# Patient Record
Sex: Male | Born: 1990 | Marital: Single | Smoking: Never smoker
Health system: Southern US, Community
[De-identification: ages and names within clinical notes are randomized; demographics above are authoritative.]

---

## 2012-03-07 ENCOUNTER — Ambulatory Visit: Payer: Medicaid Other | Attending: Orthopedic Surgery | Admitting: Physical Therapy

## 2012-03-07 DIAGNOSIS — IMO0001 Reserved for inherently not codable concepts without codable children: Secondary | ICD-10-CM | POA: Insufficient documentation

## 2012-03-07 DIAGNOSIS — M6281 Muscle weakness (generalized): Secondary | ICD-10-CM | POA: Insufficient documentation

## 2012-03-07 DIAGNOSIS — R262 Difficulty in walking, not elsewhere classified: Secondary | ICD-10-CM | POA: Insufficient documentation

## 2012-03-07 DIAGNOSIS — M25559 Pain in unspecified hip: Secondary | ICD-10-CM | POA: Insufficient documentation

## 2012-03-14 ENCOUNTER — Encounter: Payer: Medicaid Other | Admitting: Rehabilitation

## 2012-03-19 ENCOUNTER — Ambulatory Visit: Payer: Medicaid Other | Admitting: Physical Therapy

## 2012-03-21 ENCOUNTER — Encounter: Payer: Medicaid Other | Admitting: Physical Therapy

## 2012-03-26 ENCOUNTER — Ambulatory Visit: Payer: Medicaid Other | Attending: Orthopedic Surgery | Admitting: Physical Therapy

## 2012-03-26 DIAGNOSIS — R262 Difficulty in walking, not elsewhere classified: Secondary | ICD-10-CM | POA: Insufficient documentation

## 2012-03-26 DIAGNOSIS — M25559 Pain in unspecified hip: Secondary | ICD-10-CM | POA: Insufficient documentation

## 2012-03-26 DIAGNOSIS — IMO0001 Reserved for inherently not codable concepts without codable children: Secondary | ICD-10-CM | POA: Insufficient documentation

## 2012-03-26 DIAGNOSIS — M6281 Muscle weakness (generalized): Secondary | ICD-10-CM | POA: Insufficient documentation

## 2012-03-28 ENCOUNTER — Ambulatory Visit: Payer: Medicaid Other | Admitting: Rehabilitation

## 2012-04-09 ENCOUNTER — Ambulatory Visit: Payer: Medicaid Other | Admitting: Physical Therapy

## 2012-04-11 ENCOUNTER — Ambulatory Visit: Payer: Medicaid Other | Admitting: Physical Therapy

## 2012-04-16 ENCOUNTER — Encounter: Payer: Medicaid Other | Admitting: Rehabilitation

## 2012-04-18 ENCOUNTER — Ambulatory Visit: Payer: Medicaid Other | Admitting: Rehabilitation

## 2012-04-23 ENCOUNTER — Ambulatory Visit: Payer: Medicaid Other | Admitting: Rehabilitation

## 2012-04-30 ENCOUNTER — Ambulatory Visit: Payer: Medicaid Other | Attending: Orthopedic Surgery | Admitting: Rehabilitation

## 2012-04-30 DIAGNOSIS — R262 Difficulty in walking, not elsewhere classified: Secondary | ICD-10-CM | POA: Insufficient documentation

## 2012-04-30 DIAGNOSIS — IMO0001 Reserved for inherently not codable concepts without codable children: Secondary | ICD-10-CM | POA: Insufficient documentation

## 2012-04-30 DIAGNOSIS — M6281 Muscle weakness (generalized): Secondary | ICD-10-CM | POA: Insufficient documentation

## 2012-04-30 DIAGNOSIS — M25559 Pain in unspecified hip: Secondary | ICD-10-CM | POA: Insufficient documentation

## 2012-05-09 ENCOUNTER — Ambulatory Visit: Payer: Medicaid Other | Admitting: Rehabilitation

## 2012-05-16 ENCOUNTER — Ambulatory Visit: Payer: Medicaid Other | Admitting: Physical Therapy

## 2012-07-16 ENCOUNTER — Ambulatory Visit: Payer: Medicaid Other | Admitting: Rehabilitative and Restorative Service Providers"

## 2012-07-23 ENCOUNTER — Ambulatory Visit: Payer: Medicaid Other | Attending: Orthopedic Surgery | Admitting: Physical Therapy

## 2012-07-23 DIAGNOSIS — M6281 Muscle weakness (generalized): Secondary | ICD-10-CM | POA: Insufficient documentation

## 2012-07-23 DIAGNOSIS — M25559 Pain in unspecified hip: Secondary | ICD-10-CM | POA: Insufficient documentation

## 2012-07-23 DIAGNOSIS — IMO0001 Reserved for inherently not codable concepts without codable children: Secondary | ICD-10-CM | POA: Insufficient documentation

## 2012-07-23 DIAGNOSIS — R262 Difficulty in walking, not elsewhere classified: Secondary | ICD-10-CM | POA: Insufficient documentation

## 2014-11-15 ENCOUNTER — Emergency Department (HOSPITAL_COMMUNITY): Payer: Medicaid Other

## 2014-11-15 ENCOUNTER — Emergency Department (HOSPITAL_COMMUNITY)
Admission: EM | Admit: 2014-11-15 | Discharge: 2014-11-15 | Disposition: A | Discharge status: Home / Self Care | Payer: Medicaid Other | Attending: Emergency Medicine | Admitting: Emergency Medicine

## 2014-11-15 ENCOUNTER — Encounter (HOSPITAL_COMMUNITY): Payer: Self-pay | Admitting: Physical Medicine and Rehabilitation

## 2014-11-15 DIAGNOSIS — N451 Epididymitis: Secondary | ICD-10-CM | POA: Insufficient documentation

## 2014-11-15 DIAGNOSIS — IMO0002 Reserved for concepts with insufficient information to code with codable children: Secondary | ICD-10-CM

## 2014-11-15 DIAGNOSIS — N508 Other specified disorders of male genital organs: Secondary | ICD-10-CM | POA: Diagnosis present

## 2014-11-15 LAB — URINALYSIS, ROUTINE W REFLEX MICROSCOPIC
Bilirubin Urine: NEGATIVE
GLUCOSE, UA: NEGATIVE mg/dL
Hgb urine dipstick: NEGATIVE
KETONES UR: NEGATIVE mg/dL
LEUKOCYTES UA: NEGATIVE
NITRITE: NEGATIVE
Protein, ur: NEGATIVE mg/dL
Specific Gravity, Urine: 1.027 (ref 1.005–1.030)
UROBILINOGEN UA: 1 mg/dL (ref 0.0–1.0)
pH: 7.5 (ref 5.0–8.0)

## 2014-11-15 MED ORDER — DOXYCYCLINE HYCLATE 100 MG PO CAPS: 100.0000 mg | ORAL_CAPSULE | Freq: Two times a day (BID) | ORAL | Status: AC

## 2014-11-15 MED ORDER — CEFTRIAXONE SODIUM 250 MG IJ SOLR
250.0000 mg | Freq: Once | INTRAMUSCULAR | Status: AC
Start: 2014-11-15 — End: 2014-11-15
  Administered 2014-11-15: 250 mg via INTRAMUSCULAR
  Filled 2014-11-15: qty 250

## 2014-11-15 MED ORDER — LIDOCAINE HCL (PF) 1 % IJ SOLN
INTRAMUSCULAR | Status: AC
  Administered 2014-11-15: 2 mL
  Filled 2014-11-15: qty 5

## 2014-11-15 NOTE — ED Provider Notes (Signed)
CSN: [id number]     Arrival date & time 11/15/14  1156 History   First MD Initiated Contact with Patient 11/15/14 1207     Chief Complaint  Patient presents with  . Testicle Pain     (Consider location/radiation/quality/duration/timing/severity/associated sxs/prior Treatment) HPI Comments: Patient presents with testicle pain. He states that last night he started having some pain in his left testicle that's been constant and slightly worsening since that time. It's worse when he sits down or walks. He denies any burning on urination or blood in his urine. He denies any penile discharge. He is sexually active with women. He denies any history of sexual transmitted diseases. He denies abdominal pain. There is no nausea or vomiting. He has not taken anything at home for the pain.  Patient is a 24 y.o. male presenting with testicular pain.  Testicle Pain Pertinent negatives include no chest pain, no abdominal pain, no headaches and no shortness of breath.    History reviewed. No pertinent past medical history. History reviewed. No pertinent past surgical history. No family history on file. History  Substance Use Topics  . Smoking status: Never Smoker   . Smokeless tobacco: Not on file  . Alcohol Use: Yes    Review of Systems  Constitutional: Negative for fever, chills, diaphoresis and fatigue.  HENT: Negative for congestion, rhinorrhea and sneezing.   Eyes: Negative.   Respiratory: Negative for cough, chest tightness and shortness of breath.   Cardiovascular: Negative for chest pain and leg swelling.  Gastrointestinal: Negative for nausea, vomiting, abdominal pain, diarrhea and blood in stool.  Genitourinary: Positive for testicular pain. Negative for frequency, hematuria, flank pain and difficulty urinating.  Musculoskeletal: Negative for back pain and arthralgias.  Skin: Negative for rash.  Neurological: Negative for dizziness, speech difficulty, weakness, numbness and headaches.       Allergies  Review of patient's allergies indicates no known allergies.  Home Medications   Prior to Admission medications   Medication Sig Start Date End Date Taking? Authorizing Provider  doxycycline (VIBRAMYCIN) 100 MG capsule Take 1 capsule (100 mg total) by mouth 2 (two) times daily. One po bid x 10 days 11/15/14   Rolan BuccoMelanie Lenni Reckner, MD   BP 108/59 mmHg  Pulse 59  Temp(Src) 98.8 F (37.1 C) (Oral)  Resp 18  SpO2 100% Physical Exam  Constitutional: He is oriented to person, place, and time. He appears well-developed and well-nourished.  HENT:  Head: Normocephalic and atraumatic.  Eyes: Pupils are equal, round, and reactive to light.  Neck: Normal range of motion. Neck supple.  Cardiovascular: Normal rate, regular rhythm and normal heart sounds.   Pulmonary/Chest: Effort normal and breath sounds normal. No respiratory distress. He has no wheezes. He has no rales. He exhibits no tenderness.  Abdominal: Soft. Bowel sounds are normal. There is no tenderness. There is no rebound and no guarding.  Genitourinary:  Normal circumcised male penis. He has tenderness to the left testicle. There is no abnormal lie. Normal cremasteric reflex. No pain over the epididymis. No hernias palpated. No rashes or sores noted.  Musculoskeletal: Normal range of motion. He exhibits no edema.  Lymphadenopathy:    He has no cervical adenopathy.  Neurological: He is alert and oriented to person, place, and time.  Skin: Skin is warm and dry. No rash noted.  Psychiatric: He has a normal mood and affect.    ED Course  Procedures (including critical care time) Labs Review Labs Reviewed  URINALYSIS, ROUTINE W REFLEX MICROSCOPIC  Imaging Review US Scrotum  11/15/2014   CLINICAL DATA:  Left testicular pain x1 day  EXAM: SCROTAL ULTRASOUND  DOPPLER ULTRASOUND OF THE TESTICLES  TECHNIQUE: Complete ultrasound examination of the testicles, epididymis, and other scrotal structures was performed. Color  and spectral Doppler ultrasound were also utilized to evaluate blood flow to the testicles.  COMPARISON:  None.  FINDINGS: Right testicle  Measurements: 3.6 x 1.5 x 3.0 cm. No mass or microlithiasis visualized.  Left testicle  Measurements: 3.6 x 1.6 x 2.3 cm. No mass or microlithiasis visualized.  Right epididymis:  Normal in size and appearance.  Left epididymis:  Normal in size and appearance.  Hydrocele:  None visualized.  Varicocele:  Present on the left.  Pulsed Doppler interrogation of both testes demonstrates normal low resistance arterial and venous waveforms bilaterally.  IMPRESSION: Normal sonographic appearance of the bilateral testes.  No evidence of testicular torsion.  Left scrotal varicocele.   Electronically Signed   By: Charline Bills M.D.   On: 11/15/2014 13:38   [place] Art/ven Flow Abd Pelv Doppler  11/15/2014   CLINICAL DATA:  Left testicular pain x1 day  EXAM: SCROTAL ULTRASOUND  DOPPLER ULTRASOUND OF THE TESTICLES  TECHNIQUE: Complete ultrasound examination of the testicles, epididymis, and other scrotal structures was performed. Color and spectral Doppler ultrasound were also utilized to evaluate blood flow to the testicles.  COMPARISON:  None.  FINDINGS: Right testicle  Measurements: 3.6 x 1.5 x 3.0 cm. No mass or microlithiasis visualized.  Left testicle  Measurements: 3.6 x 1.6 x 2.3 cm. No mass or microlithiasis visualized.  Right epididymis:  Normal in size and appearance.  Left epididymis:  Normal in size and appearance.  Hydrocele:  None visualized.  Varicocele:  Present on the left.  Pulsed Doppler interrogation of both testes demonstrates normal low resistance arterial and venous waveforms bilaterally.  IMPRESSION: Normal sonographic appearance of the bilateral testes.  No evidence of testicular torsion.  Left scrotal varicocele.   Electronically Signed   By: Charline Bills M.D.   On: 11/15/2014 13:38     EKG Interpretation None      MDM   Final diagnoses:   Epididymitis, left    Patient has no evidence of torsion. I feel with his exam he likely should be treated for probable epididymitis. He was treated with Rocephin and doxycycline. He was given outpatient referral to follow-up with [facility] if the symptoms are not improving.    Rolan Bucco, MD 11/15/14 [phone]

## 2014-11-15 NOTE — Discharge Instructions (Signed)
Epididymitis °Epididymitis is a swelling (inflammation) of the epididymis. The epididymis is a cord-like structure along the back part of the testicle. Epididymitis is usually, but not always, caused by infection. This is usually a sudden problem beginning with chills, fever and pain behind the scrotum and in the testicle. There may be swelling and redness of the testicle. °DIAGNOSIS  °Physical examination will reveal a tender, swollen epididymis. Sometimes, cultures are obtained from the urine or from prostate secretions to help find out if there is an infection or if the cause is a different problem. Sometimes, blood work is performed to see if your white blood cell count is elevated and if a germ (bacterial) or viral infection is present. Using this knowledge, an appropriate medicine which kills germs (antibiotic) can be chosen by your caregiver. A viral infection causing epididymitis will most often go away (resolve) without treatment. °HOME CARE INSTRUCTIONS  °· Hot sitz baths for 20 minutes, 4 times per day, may help relieve pain. °· Only take over-the-counter or prescription medicines for pain, discomfort or fever as directed by your caregiver. °· Take all medicines, including antibiotics, as directed. Take the antibiotics for the full prescribed length of time even if you are feeling better. °· It is very important to keep all follow-up appointments. °SEEK IMMEDIATE MEDICAL CARE IF:  °· You have a fever. °· You have pain not relieved with medicines. °· You have any worsening of your problems. °· Your pain seems to come and go. °· You develop pain, redness, and swelling in the scrotum and surrounding areas. °MAKE SURE YOU:  °· Understand these instructions. °· Will watch your condition. °· Will get help right away if you are not doing well or get worse. °Document Released: 06/09/2000 Document Revised: 09/04/2011 Document Reviewed: 04/29/2009 °[employer]® Patient Information ©2015 [employer]. This information  is not intended to replace advice given to you by your health care provider. Make sure you discuss any questions you have with your health care provider. ° °

## 2014-11-15 NOTE — ED Notes (Signed)
Pt is tender to palpation on left testicle.

## 2014-11-15 NOTE — ED Notes (Signed)
Pt reports L testicle pain. Onset Saturday evening. Denies recent injury. Denies urinary symptoms. 5/10 pain upon arrival to ED.

## 2014-11-15 NOTE — ED Notes (Signed)
Pt is in stable condition upon d/c and ambulates from ED. 

## 2015-02-08 ENCOUNTER — Encounter (HOSPITAL_COMMUNITY): Payer: Self-pay | Admitting: *Deleted

## 2015-02-08 ENCOUNTER — Emergency Department (HOSPITAL_COMMUNITY)
Admission: EM | Admit: 2015-02-08 | Discharge: 2015-02-08 | Disposition: A | Discharge status: Home / Self Care | Payer: Medicaid Other | Attending: Emergency Medicine | Admitting: Emergency Medicine

## 2015-02-08 DIAGNOSIS — Z79899 Other long term (current) drug therapy: Secondary | ICD-10-CM | POA: Insufficient documentation

## 2015-02-08 DIAGNOSIS — R109 Unspecified abdominal pain: Secondary | ICD-10-CM

## 2015-02-08 LAB — COMPREHENSIVE METABOLIC PANEL
ALK PHOS: 59 U/L (ref 38–126)
ALT: 17 U/L (ref 17–63)
AST: 23 U/L (ref 15–41)
Albumin: 3.7 g/dL (ref 3.5–5.0)
Anion gap: 8 (ref 5–15)
BUN: 10 mg/dL (ref 6–20)
CALCIUM: 8.8 mg/dL — AB (ref 8.9–10.3)
CHLORIDE: 106 mmol/L (ref 101–111)
CO2: 25 mmol/L (ref 22–32)
CREATININE: 0.86 mg/dL (ref 0.61–1.24)
GFR calc Af Amer: >60 mL/min (ref >60)
GFR calc non Af Amer: >60 mL/min (ref >60)
Glucose, Bld: 99 mg/dL (ref 65–99)
Potassium: 3.9 mmol/L (ref 3.5–5.1)
SODIUM: 139 mmol/L (ref 135–145)
Total Bilirubin: 0.9 mg/dL (ref 0.3–1.2)
Total Protein: 6.3 g/dL — ABNORMAL LOW (ref 6.5–8.1)

## 2015-02-08 LAB — URINALYSIS, ROUTINE W REFLEX MICROSCOPIC
BILIRUBIN URINE: NEGATIVE
GLUCOSE, UA: NEGATIVE mg/dL
Hgb urine dipstick: NEGATIVE
KETONES UR: NEGATIVE mg/dL
Leukocytes, UA: NEGATIVE
Nitrite: NEGATIVE
PROTEIN: NEGATIVE mg/dL
Specific Gravity, Urine: 1.018 (ref 1.005–1.030)
Urobilinogen, UA: 0.2 mg/dL (ref 0.0–1.0)
pH: 7.5 (ref 5.0–8.0)

## 2015-02-08 LAB — CBC
HCT: 39.4 % (ref 39.0–52.0)
Hemoglobin: 13.4 g/dL (ref 13.0–17.0)
MCH: 30.5 pg (ref 26.0–34.0)
MCHC: 34 g/dL (ref 30.0–36.0)
MCV: 89.5 fL (ref 78.0–100.0)
PLATELETS: 215 10*3/uL (ref 150–400)
RBC: 4.4 MIL/uL (ref 4.22–5.81)
RDW: 12.1 % (ref 11.5–15.5)
WBC: 3.7 10*3/uL — ABNORMAL LOW (ref 4.0–10.5)

## 2015-02-08 LAB — LIPASE, BLOOD: LIPASE: 31 U/L (ref 22–51)

## 2015-02-08 NOTE — ED Notes (Signed)
Pt reports being woken up by lower abd pain this am. Denies urinary symptoms or n/v/d.

## 2015-02-08 NOTE — ED Provider Notes (Signed)
CSN: [id number]     Arrival date & time 02/08/15  1059 History   First MD Initiated Contact with Patient 02/08/15 1221     Chief Complaint  Patient presents with  . Abdominal Pain     (Consider location/radiation/quality/duration/timing/severity/associated sxs/prior Treatment) HPI Patient presents to the emergency department with abdominal pain that woke him up this morning.  The patient states that he had significant left-sided lower abdominal pain that awoke him from sleep.  Patient states this time.  He has no pain.  He denies nausea, vomiting, diarrhea, chest pain, shortness of breath, back pain, fever, incontinence, hematemesis, bloody stool, dysuria, hematuria, or syncope.  The patient states that he currently has no pain that has been pain-free for several hours.  Patient did not take any medications prior to arrival for his symptoms History reviewed. No pertinent past medical history. History reviewed. No pertinent past surgical history. History reviewed. No pertinent family history. Social History  Substance Use Topics  . Smoking status: Never Smoker   . Smokeless tobacco: None  . Alcohol Use: Yes    Review of Systems All other systems negative except as documented in the HPI. All pertinent positives and negatives as reviewed in the HPI.  Allergies  Review of patient's allergies indicates no known allergies.  Home Medications   Prior to Admission medications   Medication Sig Start Date End Date Taking? Authorizing Provider  Multiple Vitamins-Minerals (MULTIVITAMIN WITH MINERALS) tablet Take 1 tablet by mouth daily.   Yes Historical Provider, MD  doxycycline (VIBRAMYCIN) 100 MG capsule Take 1 capsule (100 mg total) by mouth 2 (two) times daily. One po bid x 10 days Patient not taking: Reported on 02/08/2015 11/15/14   Rolan Bucco, MD   BP 110/71 mmHg  Pulse 54  Temp(Src) 98 F (36.7 C) (Oral)  Resp 18  Ht  (1.702 m)  Wt 120 lb (54.432 kg)  BMI 18.79 kg/m2   SpO2 100% Physical Exam  Constitutional: He is oriented to person, place, and time. He appears well-developed and well-nourished. No distress.  HENT:  Head: Normocephalic and atraumatic.  Mouth/Throat: Oropharynx is clear and moist.  Eyes: Pupils are equal, round, and reactive to light.  Neck: Normal range of motion. Neck supple.  Cardiovascular: Normal rate, regular rhythm and normal heart sounds.  Exam reveals no gallop and no friction rub.   No murmur heard. Pulmonary/Chest: Effort normal and breath sounds normal. No respiratory distress.  Abdominal: Soft. Bowel sounds are normal. He exhibits no distension. There is no tenderness. There is no rebound and no guarding.  Neurological: He is alert and oriented to person, place, and time. He exhibits normal muscle tone. Coordination normal.  Skin: Skin is warm and dry. No rash noted. No erythema.  Psychiatric: He has a normal mood and affect. His behavior is normal.  Nursing note and vitals reviewed.   ED Course  Procedures (including critical care time) Labs Review Labs Reviewed  COMPREHENSIVE METABOLIC PANEL - Abnormal; Notable for the following:    Calcium 8.8 (*)    Total Protein 6.3 (*)    All other components within normal limits  CBC - Abnormal; Notable for the following:    WBC 3.7 (*)    All other components within normal limits  URINALYSIS, ROUTINE W REFLEX MICROSCOPIC (NOT AT [facility]) - Abnormal; Notable for the following:    APPearance HAZY (*)    All other components within normal limits  LIPASE, BLOOD    Imaging Review No results  found. I, Theran Vandergrift W, personally reviewed and evaluated these images and lab results as part of my medical decision-making.   Patient currently has no pain.  He will be discharged home and advised him to return here for any worsening in his condition.  The patient agrees to the plan and all questions were answered  Charlestine Night, PA-C 02/08/15 1600  Laurence Spates,  MD 02/08/15 [phone]

## 2015-02-08 NOTE — Discharge Instructions (Signed)
Return here as needed.  Follow up with a primary care doctor °

## 2015-11-25 IMAGING — US US ART/VEN ABD/PELV/SCROTUM DOPPLER LTD
1 series · 14 of 25 positions shown · non-contrast
Comparison: None.

CLINICAL DATA: Left testicular pain x1 day

EXAM:
SCROTAL ULTRASOUND
DOPPLER ULTRASOUND OF THE TESTICLES
TECHNIQUE: Complete ultrasound examination of the testicles, epididymis, and
other scrotal structures was performed. Color and spectral Doppler
ultrasound were also utilized to evaluate blood flow to the
testicles.

[Series 1: us art/ven abd/pelv/scrotum doppler ltd · 0.05mm/px · 64 acquisitions, 14 frames shown]
[im 1/64]
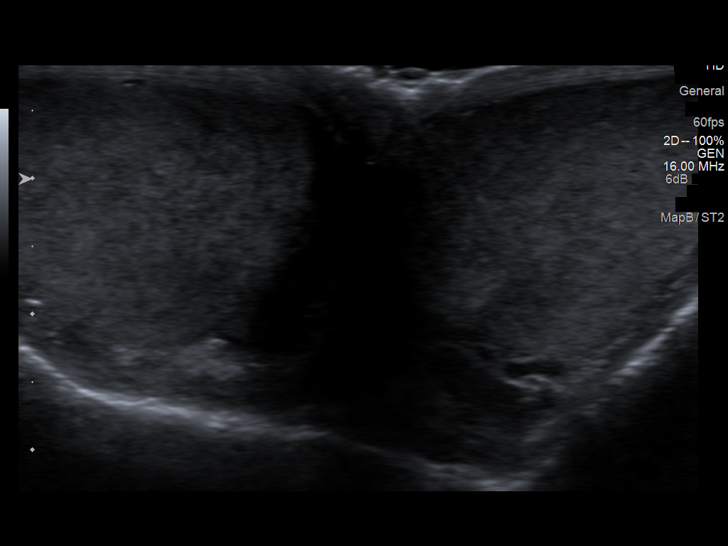
[im 6/64]
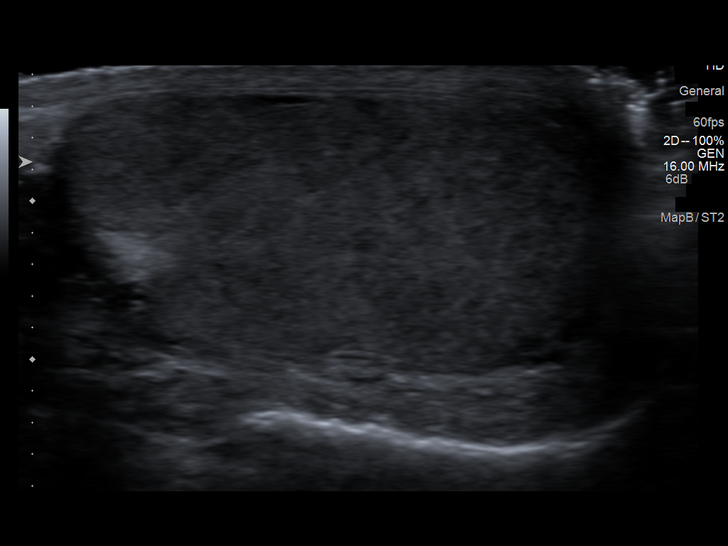
[im 11/64]
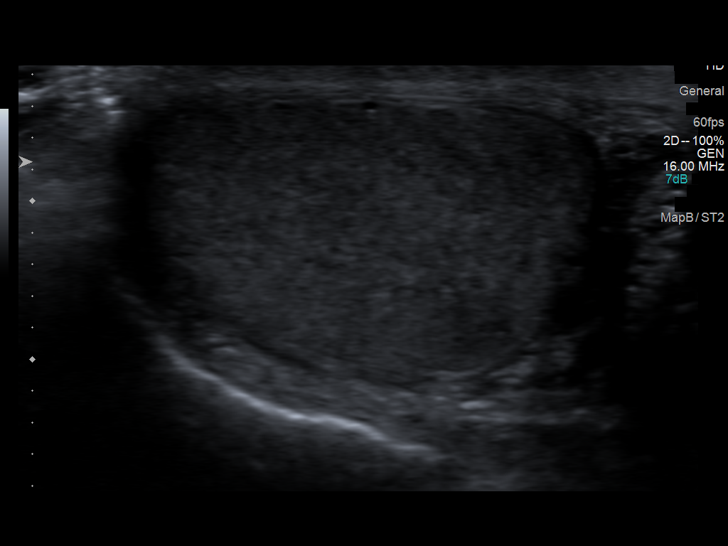
[im 16/64]
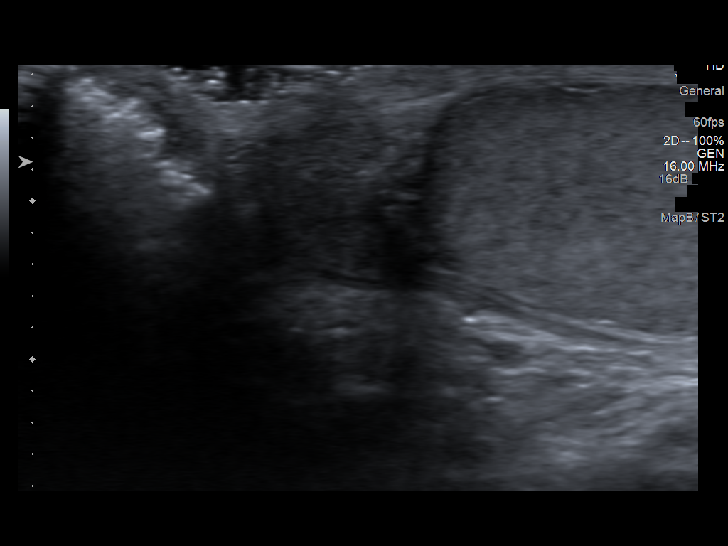
[im 22/64]
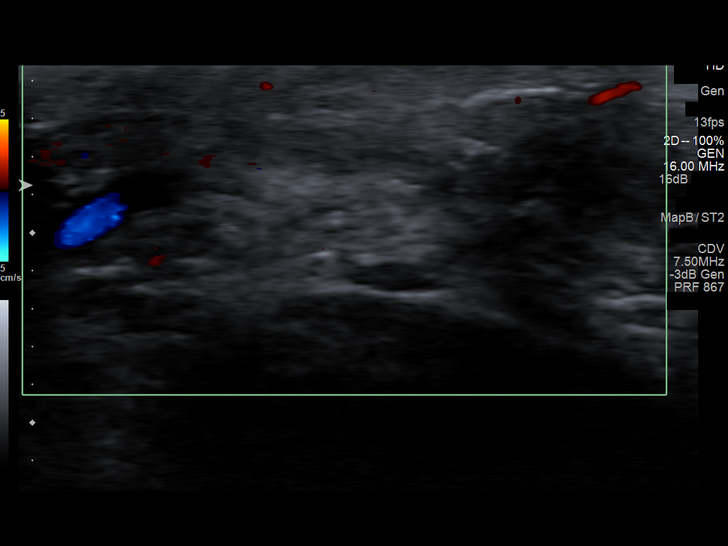
[im 24/64]
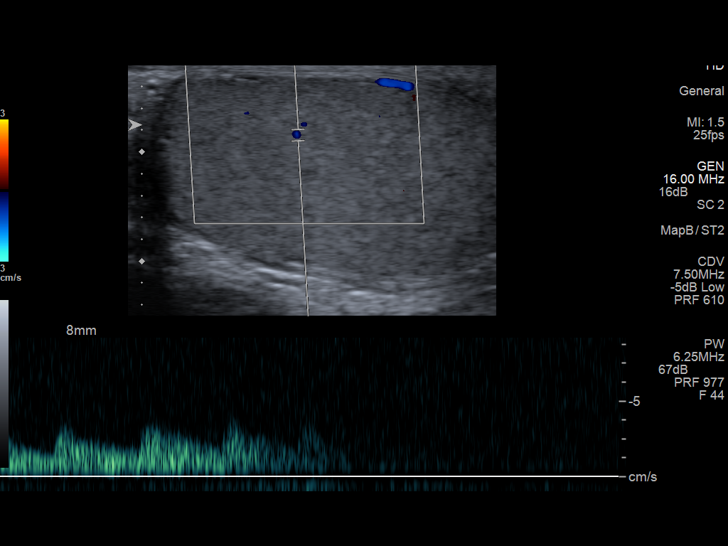
[im 29/64]
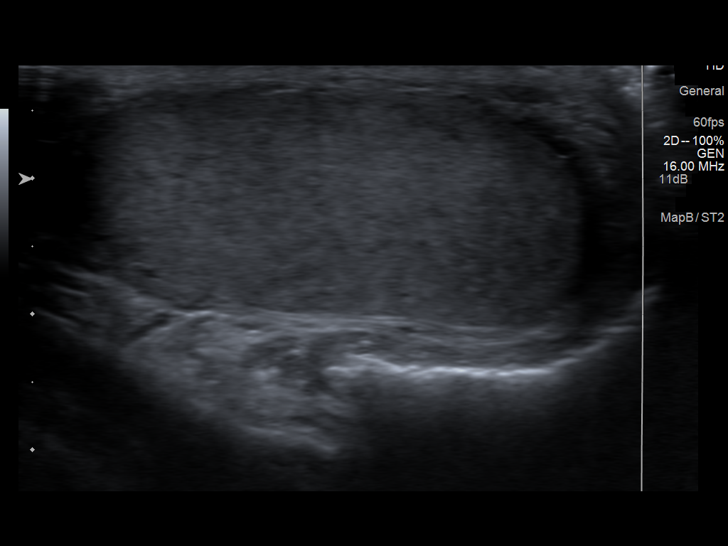
[im 35/64]
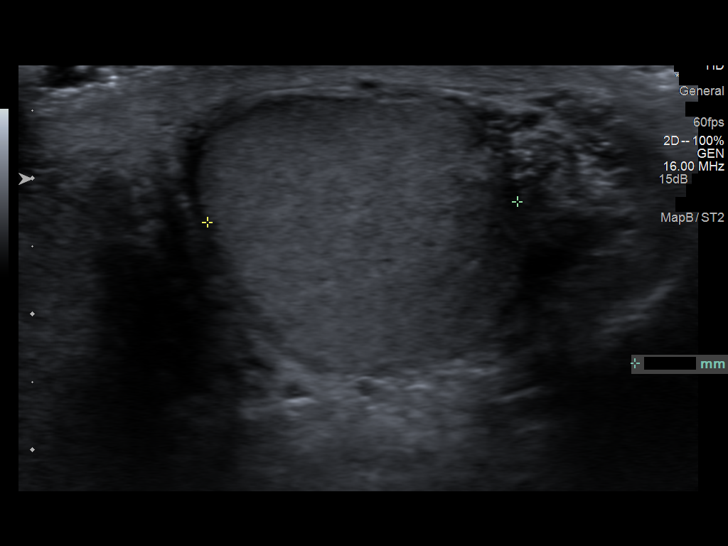
[im 40/64]
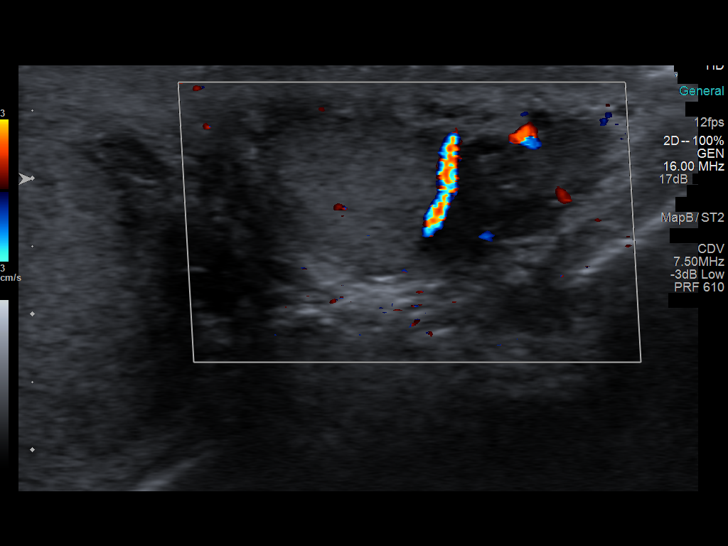
[im 43/64]
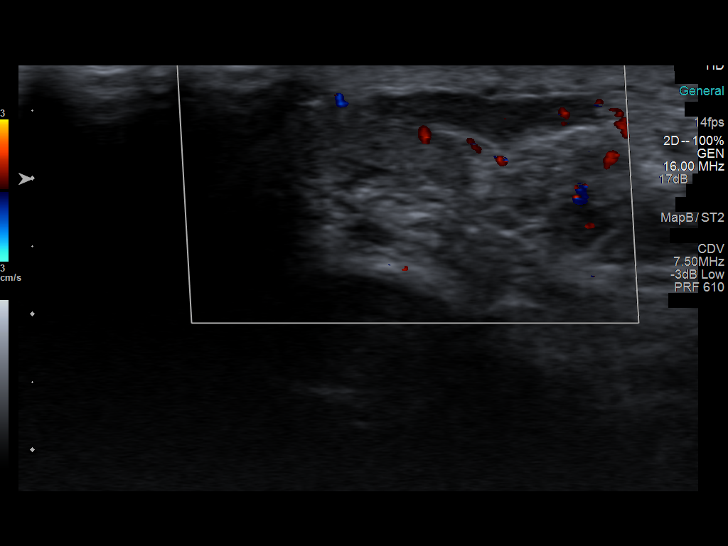
[im 48/64]
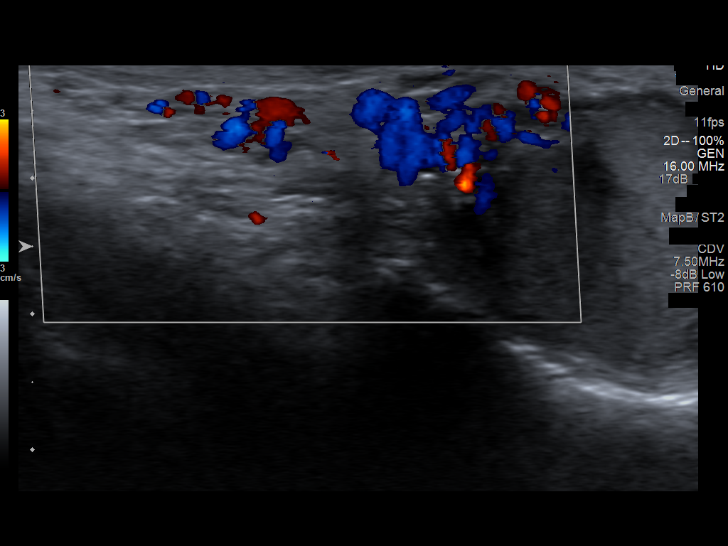
[im 53/64]
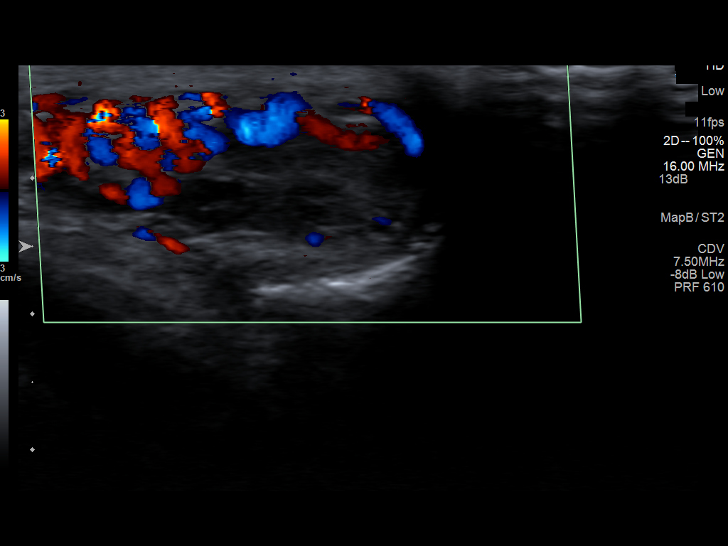
[im 58/64]
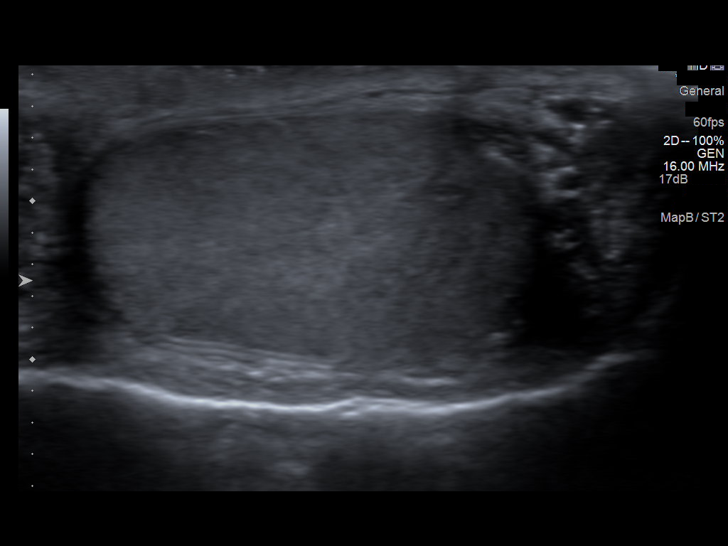
[im 64/64]
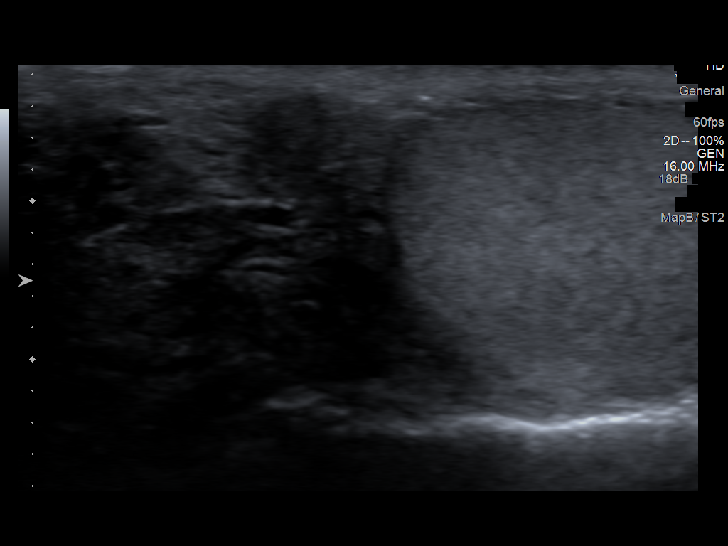

[14 of 25 positions shown; findings below may reference images not displayed]

FINDINGS: Right testicle

Measurements: 3.6 x 1.5 x 3.0 cm. No mass or microlithiasis
visualized.

Left testicle

Measurements: 3.6 x 1.6 x 2.3 cm. No mass or microlithiasis
visualized.

Right epididymis:  Normal in size and appearance.

Left epididymis:  Normal in size and appearance.

Hydrocele:  None visualized.

Varicocele:  Present on the left.

Pulsed Doppler interrogation of both testes demonstrates normal low
resistance arterial and venous waveforms bilaterally.
IMPRESSION: Normal sonographic appearance of the bilateral testes.

No evidence of testicular torsion.

Left scrotal varicocele.
